# Patient Record
Sex: Female | Born: 1955 | Race: Black or African American | Hispanic: No | Marital: Single | Smoking: Never smoker
Health system: Southern US, Community
[De-identification: ages and names within clinical notes are randomized; demographics above are authoritative.]

## PROBLEM LIST (undated history)

## (undated) DIAGNOSIS — I10 Essential (primary) hypertension: Secondary | ICD-10-CM

## (undated) DIAGNOSIS — E119 Type 2 diabetes mellitus without complications: Secondary | ICD-10-CM

---

## 2009-01-30 ENCOUNTER — Emergency Department (HOSPITAL_COMMUNITY): Admission: EM | Admit: 2009-01-30 | Discharge: 2009-01-30 | Payer: Self-pay | Admitting: Emergency Medicine

## 2010-01-30 ENCOUNTER — Emergency Department (HOSPITAL_COMMUNITY): Admission: EM | Admit: 2010-01-30 | Discharge: 2010-01-30 | Payer: Self-pay | Admitting: Emergency Medicine

## 2010-02-04 ENCOUNTER — Emergency Department (HOSPITAL_COMMUNITY): Admission: EM | Admit: 2010-02-04 | Discharge: 2010-02-04 | Payer: Self-pay | Admitting: Emergency Medicine

## 2010-04-16 ENCOUNTER — Emergency Department (HOSPITAL_COMMUNITY): Admission: EM | Admit: 2010-04-16 | Discharge: 2010-04-16 | Payer: Self-pay | Admitting: Emergency Medicine

## 2011-02-23 LAB — GLUCOSE, CAPILLARY: Glucose-Capillary: 160 mg/dL — ABNORMAL HIGH (ref 70–99)

## 2014-01-09 ENCOUNTER — Emergency Department (HOSPITAL_COMMUNITY)
Admission: EM | Admit: 2014-01-09 | Discharge: 2014-01-09 | Disposition: A | Discharge status: Home / Self Care | Payer: BC Managed Care – PPO | Attending: Emergency Medicine | Admitting: Emergency Medicine

## 2014-01-09 ENCOUNTER — Emergency Department (HOSPITAL_COMMUNITY): Payer: BC Managed Care – PPO

## 2014-01-09 DIAGNOSIS — Y9389 Activity, other specified: Secondary | ICD-10-CM | POA: Insufficient documentation

## 2014-01-09 DIAGNOSIS — W010XXA Fall on same level from slipping, tripping and stumbling without subsequent striking against object, initial encounter: Secondary | ICD-10-CM | POA: Insufficient documentation

## 2014-01-09 DIAGNOSIS — S20211A Contusion of right front wall of thorax, initial encounter: Secondary | ICD-10-CM

## 2014-01-09 DIAGNOSIS — Y929 Unspecified place or not applicable: Secondary | ICD-10-CM | POA: Insufficient documentation

## 2014-01-09 DIAGNOSIS — S20219A Contusion of unspecified front wall of thorax, initial encounter: Secondary | ICD-10-CM | POA: Insufficient documentation

## 2014-01-09 DIAGNOSIS — IMO0002 Reserved for concepts with insufficient information to code with codable children: Secondary | ICD-10-CM | POA: Insufficient documentation

## 2014-01-09 DIAGNOSIS — Z79899 Other long term (current) drug therapy: Secondary | ICD-10-CM | POA: Insufficient documentation

## 2014-01-09 MED ORDER — IBUPROFEN 800 MG PO TABS: 800.0000 mg | ORAL_TABLET | Freq: Three times a day (TID) | ORAL | Status: DC

## 2014-01-09 MED ORDER — OXYCODONE-ACETAMINOPHEN 5-325 MG PO TABS
1.0000 | ORAL_TABLET | Freq: Once | ORAL | Status: AC
  Administered 2014-01-09: 1 via ORAL
  Filled 2014-01-09: qty 1

## 2014-01-09 MED ORDER — OXYCODONE-ACETAMINOPHEN 5-325 MG PO TABS: 1.0000 | ORAL_TABLET | ORAL | Status: DC | PRN

## 2014-01-09 NOTE — ED Provider Notes (Signed)
Medical screening examination/treatment/procedure(s) were performed by non-physician practitioner and as supervising physician I was immediately available for consultation/collaboration.  Gerhard Munchobert Galdino Hinchman, MD 01/09/14 1550

## 2014-01-09 NOTE — ED Provider Notes (Signed)
CSN: [id number]     Arrival date & time 01/09/14  0812 History  This chart was scribed for non-physician practitioner Elpidio AnisShari Dessire Grimes, PA-C working with Gerhard Munchobert Lockwood, MD by Dorothey Basemania Sutton, ED Scribe. This patient was seen in room TR07C/TR07C and the patient's care was started at 9:03 AM.    Chief Complaint  Patient presents with  . Fall   The history is provided by the patient. No language interpreter was used.   HPI Comments: Tabitha Nichols is a 58 y.o. female who presents to the Emergency Department complaining of a fall that occurred 4 days ago when she states she tripped over her dog, causing her to hit the right side of her body on her bed. She reports an associated pain to the right ribs and right breast secondary to the incident that she states is exacerbated with deep breathing and movement. Patient reports some mild, pleuritic chest pain. She denies any bleeding from the nipple, abdominal pain, shortness of breath. Patient has no other pertinent medical history.   No past medical history on file. No past surgical history on file. No family history on file. History  Substance Use Topics  . Smoking status: Not on file  . Smokeless tobacco: Not on file  . Alcohol Use: Not on file   OB History   No data available     Review of Systems  Respiratory: Negative for shortness of breath.        Right rib pain  Cardiovascular: Positive for chest pain.  Gastrointestinal: Negative for abdominal pain.  All other systems reviewed and are negative.   Allergies  Flagyl  Home Medications   Current Outpatient Rx  Name  Route  Sig  Dispense  Refill  . budesonide (RHINOCORT AQUA) 32 MCG/ACT nasal spray   Each Nare   Place 1 spray into both nostrils daily.         . hydrochlorothiazide (HYDRODIURIL) 12.5 MG tablet   Oral   Take 12.5 mg by mouth daily.         . metFORMIN (GLUCOPHAGE) 1000 MG tablet   Oral   Take 1,000 mg by mouth 2 (two) times daily with a meal.         .  NIFEdipine (PROCARDIA-XL/ADALAT-CC/NIFEDICAL-XL) 30 MG 24 hr tablet   Oral   Take 30 mg by mouth daily.         . valsartan (DIOVAN) 320 MG tablet   Oral   Take 320 mg by mouth daily.          Triage Vitals: BP 188/97  Pulse 65  Temp(Src) 98.6 F (37 C) (Oral)  Resp 22  Ht 5\' 7"  (1.702 m)  Wt 193 lb 3.2 oz (87.635 kg)  BMI 30.25 kg/m2  SpO2 98%  Physical Exam  Nursing note and vitals reviewed. Constitutional: She is oriented to person, place, and time. She appears well-developed and well-nourished. No distress.  HENT:  Head: Normocephalic and atraumatic.  Eyes: Conjunctivae are normal.  Neck: Normal range of motion. Neck supple.  Cardiovascular: Normal rate, regular rhythm and normal heart sounds.   Pulmonary/Chest: Effort normal and breath sounds normal. No respiratory distress.  Chest wall appears atraumatic with no ecchymosis, including he right breast. Mildly tender to the right, lower anterior chest wall without other torso tenderness.   Abdominal: Soft. She exhibits no distension. There is no tenderness.  No RUQ tenderness.   Musculoskeletal: Normal range of motion.  Neurological: She is alert and oriented to person, place,  and time.  Skin: Skin is warm and dry.  Psychiatric: She has a normal mood and affect. Her behavior is normal.    ED Course  Procedures (including critical care time)  DIAGNOSTIC STUDIES: Oxygen Saturation is 98% on room air, normal by my interpretation.    COORDINATION OF CARE: 9:05 AM- Ordered a chest x-ray. Ordered Percocet to manage symptoms. Discussed treatment plan with patient at bedside and patient verbalized agreement.     Labs Review Labs Reviewed - No data to display  Imaging Review Dg Ribs Unilateral W/chest Right  01/09/2014   CLINICAL DATA:  Larey Seat on that post Saturday. Pain anterior right chest and ribs.  EXAM: RIGHT RIBS AND CHEST - 3+ VIEW  COMPARISON:  None.  FINDINGS: No fracture or other bone lesions are seen  involving the ribs. There is no evidence of pneumothorax or pleural effusion. Both lungs are clear. Heart size and mediastinal contours are within normal limits.  IMPRESSION: Negative.   Electronically Signed   By: Britta Mccreedy M.D.   On: 01/09/2014 09:18    EKG Interpretation   None       MDM   Final diagnoses:  None    1. CONTUSION CHEST WALL VSS, no hypoxia, mild tenderness to palpation. Negative CXR. Stable for discharge.   I personally performed the services described in this documentation, which was scribed in my presence. The recorded information has been reviewed and is accurate.       Arnoldo Hooker, PA-C 01/09/14 [phone]

## 2014-01-09 NOTE — ED Notes (Signed)
Larey SeatFell and hit rt side of chest against bed rail after trying to catch dog on Friday hurts to breath

## 2014-01-09 NOTE — Discharge Instructions (Signed)
Chest Contusion °A chest contusion is a deep bruise on your chest area. Contusions are the result of an injury that caused bleeding under the skin. A chest contusion may involve bruising of the skin, muscles, or ribs. The contusion may turn blue, purple, or yellow. Minor injuries will give you a painless contusion, but more severe contusions may stay painful and swollen for a few weeks. °CAUSES  °A contusion is usually caused by a blow, trauma, or direct force to an area of the body. °SYMPTOMS  °· Swelling and redness of the injured area. °· Discoloration of the injured area. °· Tenderness and soreness of the injured area. °· Pain. °DIAGNOSIS  °The diagnosis can be made by taking a history and performing a physical exam. An X-ray, CT scan, or MRI may be needed to determine if there were any associated injuries, such as broken bones (fractures) or internal injuries. °TREATMENT  °Often, the best treatment for a chest contusion is resting, icing, and applying cold compresses to the injured area. Deep breathing exercises may be recommended to reduce the risk of pneumonia. Over-the-counter medicines may also be recommended for pain control. °HOME CARE INSTRUCTIONS  °· Put ice on the injured area. °· Put ice in a plastic bag. °· Place a towel between your skin and the bag. °· Leave the ice on for 15-20 minutes, 03-04 times a day. °· Only take over-the-counter or prescription medicines as directed by your caregiver. Your caregiver may recommend avoiding anti-inflammatory medicines (aspirin, ibuprofen, and naproxen) for 48 hours because these medicines may increase bruising. °· Rest the injured area. °· Perform deep-breathing exercises as directed by your caregiver. °· Stop smoking if you smoke. °· Do not lift objects over 5 pounds (2.3 kg) for 3 days or longer if recommended by your caregiver. °SEEK IMMEDIATE MEDICAL CARE IF:  °· You have increased bruising or swelling. °· You have pain that is getting worse. °· You have  difficulty breathing. °· You have dizziness, weakness, or fainting. °· You have blood in your urine or stool. °· You cough up or vomit blood. °· Your swelling or pain is not relieved with medicines. °MAKE SURE YOU:  °· Understand these instructions. °· Will watch your condition. °· Will get help right away if you are not doing well or get worse. °Document Released: 08/11/2001 Document Revised: 08/10/2012 Document Reviewed: 05/09/2012 °[employer]® Patient Information ©2014 [employer]. ° °

## 2014-08-06 ENCOUNTER — Encounter (HOSPITAL_COMMUNITY): Payer: Self-pay | Admitting: Emergency Medicine

## 2014-08-06 ENCOUNTER — Emergency Department (HOSPITAL_COMMUNITY): Payer: BC Managed Care – PPO

## 2014-08-06 ENCOUNTER — Emergency Department (HOSPITAL_COMMUNITY)
Admission: EM | Admit: 2014-08-06 | Discharge: 2014-08-06 | Disposition: A | Discharge status: Home / Self Care | Payer: BC Managed Care – PPO | Attending: Emergency Medicine | Admitting: Emergency Medicine

## 2014-08-06 DIAGNOSIS — S20211A Contusion of right front wall of thorax, initial encounter: Secondary | ICD-10-CM

## 2014-08-06 DIAGNOSIS — S20229A Contusion of unspecified back wall of thorax, initial encounter: Secondary | ICD-10-CM | POA: Diagnosis not present

## 2014-08-06 DIAGNOSIS — IMO0002 Reserved for concepts with insufficient information to code with codable children: Secondary | ICD-10-CM | POA: Insufficient documentation

## 2014-08-06 DIAGNOSIS — W108XXA Fall (on) (from) other stairs and steps, initial encounter: Secondary | ICD-10-CM | POA: Diagnosis not present

## 2014-08-06 DIAGNOSIS — Y9289 Other specified places as the place of occurrence of the external cause: Secondary | ICD-10-CM | POA: Diagnosis not present

## 2014-08-06 DIAGNOSIS — W010XXA Fall on same level from slipping, tripping and stumbling without subsequent striking against object, initial encounter: Secondary | ICD-10-CM | POA: Insufficient documentation

## 2014-08-06 DIAGNOSIS — S20219A Contusion of unspecified front wall of thorax, initial encounter: Secondary | ICD-10-CM | POA: Insufficient documentation

## 2014-08-06 DIAGNOSIS — E119 Type 2 diabetes mellitus without complications: Secondary | ICD-10-CM | POA: Insufficient documentation

## 2014-08-06 DIAGNOSIS — S300XXA Contusion of lower back and pelvis, initial encounter: Secondary | ICD-10-CM

## 2014-08-06 DIAGNOSIS — Z79899 Other long term (current) drug therapy: Secondary | ICD-10-CM | POA: Insufficient documentation

## 2014-08-06 DIAGNOSIS — I10 Essential (primary) hypertension: Secondary | ICD-10-CM | POA: Diagnosis not present

## 2014-08-06 DIAGNOSIS — Y9389 Activity, other specified: Secondary | ICD-10-CM | POA: Diagnosis not present

## 2014-08-06 HISTORY — DX: Type 2 diabetes mellitus without complications: E11.9

## 2014-08-06 HISTORY — DX: Essential (primary) hypertension: I10

## 2014-08-06 MED ORDER — IBUPROFEN 800 MG PO TABS
800.0000 mg | ORAL_TABLET | Freq: Once | ORAL | Status: AC
  Administered 2014-08-06: 800 mg via ORAL
  Filled 2014-08-06: qty 1

## 2014-08-06 MED ORDER — IBUPROFEN 800 MG PO TABS: 800.0000 mg | ORAL_TABLET | Freq: Three times a day (TID) | ORAL | Status: AC | PRN

## 2014-08-06 MED ORDER — HYDROCODONE-ACETAMINOPHEN 5-325 MG PO TABS
1.0000 | ORAL_TABLET | Freq: Once | ORAL | Status: AC
Start: 2014-08-06 — End: 2014-08-06
  Administered 2014-08-06: 1 via ORAL
  Filled 2014-08-06: qty 1

## 2014-08-06 MED ORDER — HYDROCODONE-ACETAMINOPHEN 5-325 MG PO TABS: 1.0000 | ORAL_TABLET | Freq: Four times a day (QID) | ORAL | Status: AC | PRN

## 2014-08-06 NOTE — Progress Notes (Signed)
  CARE MANAGEMENT ED NOTE 08/06/2014  Patient:  Tabitha Nichols, Tabitha Nichols   Account Number:  [id number]  Date Initiated:  08/06/2014  Documentation initiated by:  Edd Arbour  Subjective/Objective Assessment:   58 yr old [place] early this morning she fell/slipped down her stairs. Denies hitting head, denies LOC. Reports right sided back pain 7/10 at present. Ambulatory.     Subjective/Objective Assessment Detail:   pcp is olaff massenburg PA [phone]  Pt inquired about pain medication until her xrays return ED CM notified PA/NP, chris     Action/Plan:   EPIC updated   Action/Plan Detail:   Anticipated DC Date:  08/06/2014     Status Recommendation to Physician:   Result of Recommendation:    Other ED Services  Consult Working Plan    DC Planning Services  Other  PCP issues  Outpatient Services - Pt will follow up    Choice offered to / List presented to:            Status of service:  Completed, signed off  ED Comments:   ED Comments Detail:

## 2014-08-06 NOTE — ED Provider Notes (Signed)
CSN: [id number]     Arrival date & time 08/06/14  1440 History  This chart was scribed for non-physician practitioner, Ebbie Ridge, PA-C working with Mirian Mo, MD by Greggory Stallion, ED scribe. This patient was seen in room WTR9/WTR9 and the patient's care was started at 3:46 PM.   Chief Complaint  Patient presents with  . Fall  . Back Pain   The history is provided by the patient. No language interpreter was used.   HPI Comments: Tabitha Nichols is a 58 y.o. female who presents to the Emergency Department complaining of a fall that occurred earlier this morning. States she slipped and fell down 3-4 steps, landing on her back. Denies hitting her head or LOC. She has sudden onset right lower back pain that radiates into her buttock. Movements worsen the pain.   Past Medical History  Diagnosis Date  . Hypertension   . Diabetes mellitus without complication    History reviewed. No pertinent past surgical history. History reviewed. No pertinent family history. History  Substance Use Topics  . Smoking status: Never Smoker   . Smokeless tobacco: Not on file  . Alcohol Use: Yes     Comment: occasionally   OB History   Grav Para Term Preterm Abortions TAB SAB Ect Mult Living                 Review of Systems All other systems negative except as documented in the HPI. All pertinent positives and negatives as reviewed in the HPI.  Allergies  Flagyl  Home Medications   Prior to Admission medications   Medication Sig Start Date End Date Taking? Authorizing Provider  budesonide (RHINOCORT AQUA) 32 MCG/ACT nasal spray Place 1 spray into both nostrils daily.    Historical Provider, MD  hydrochlorothiazide (HYDRODIURIL) 12.5 MG tablet Take 12.5 mg by mouth daily.    Historical Provider, MD  ibuprofen (ADVIL,MOTRIN) 800 MG tablet Take 1 tablet (800 mg total) by mouth 3 (three) times daily. 01/09/14   Shari A Upstill, PA-C  metFORMIN (GLUCOPHAGE) 1000 MG tablet Take 1,000 mg by mouth 2  (two) times daily with a meal.    Historical Provider, MD  NIFEdipine (PROCARDIA-XL/ADALAT-CC/NIFEDICAL-XL) 30 MG 24 hr tablet Take 30 mg by mouth daily.    Historical Provider, MD  oxyCODONE-acetaminophen (PERCOCET/ROXICET) 5-325 MG per tablet Take 1 tablet by mouth every 4 (four) hours as needed for severe pain. 01/09/14   Shari A Upstill, PA-C  valsartan (DIOVAN) 320 MG tablet Take 320 mg by mouth daily.    Historical Provider, MD   BP 149/88  Pulse 69  Temp(Src) 98.5 F (36.9 C) (Oral)  Resp 16  SpO2 100%  Physical Exam  Nursing note and vitals reviewed. Constitutional: She is oriented to person, place, and time. She appears well-developed and well-nourished. No distress.  HENT:  Head: Normocephalic and atraumatic.  Eyes: Conjunctivae and EOM are normal.  Neck: Neck supple.  Cardiovascular: Normal rate.   Pulmonary/Chest: Effort normal. No respiratory distress.  Musculoskeletal: Normal range of motion.  Tenderness over right lateral and posterior ribs. Tenderness to right lower back.  Neurological: She is alert and oriented to person, place, and time.  Skin: Skin is warm and dry.  Psychiatric: She has a normal mood and affect. Her behavior is normal.    ED Course  Procedures (including critical care time)  DIAGNOSTIC STUDIES: Oxygen Saturation is 10% on RA, normal by my interpretation.    COORDINATION OF CARE: 3:48 PM-Discussed treatment plan which  includes xray with pt at bedside and pt agreed to plan.   Labs Review Labs Reviewed - No data to display  Imaging Review Dg Ribs Unilateral W/chest Right  08/06/2014   CLINICAL DATA:  Fall, anterior right rib pain  EXAM: RIGHT RIBS AND CHEST - 3+ VIEW  COMPARISON:  01/09/2014 similar exam  FINDINGS: No fracture or other bone lesions are seen involving the ribs. There is no evidence of pneumothorax or pleural effusion. Both lungs are clear. Heart size and mediastinal contours are within normal limits.  IMPRESSION: Negative.    Electronically Signed   By: Christiana Pellant M.D.   On: 08/06/2014 16:27   Dg Lumbar Spine Complete  08/06/2014   CLINICAL DATA:  Fall, low back pain  EXAM: LUMBAR SPINE - COMPLETE 4+ VIEW  COMPARISON:  None.  FINDINGS: There is no evidence of lumbar spine fracture. Alignment is normal. Intervertebral disc spaces are maintained. Minimal leftward curvature centered at L1. Minimal disc degenerative change noted at L3-L4 and L4-L5.  IMPRESSION: Negative.   Electronically Signed   By: Christiana Pellant M.D.   On: 08/06/2014 16:26      I personally performed the services described in this documentation, which was scribed in my presence. The recorded information has been reviewed and is accurate.  Carlyle Dolly, PA-C 08/13/14 [phone]

## 2014-08-06 NOTE — ED Notes (Signed)
Pt knows not to take extra tylenol and not to drink alcohol/drive/operate heavy machinery with medications prescribed. No other questions/concerns. Ambulatory with steady gait.

## 2014-08-06 NOTE — Discharge Instructions (Signed)
Return here as needed. Follow up with your doctor. Your x-rays were normal. Use ice and heat on the areas that are sore.

## 2014-08-06 NOTE — ED Notes (Signed)
Pt reports early this morning she fell/slipped down her stairs. Denies hitting head, denies LOC. Reports right sided back pain 7/10 at present. Ambulatory.

## 2014-08-11 ENCOUNTER — Emergency Department (HOSPITAL_COMMUNITY)
Admission: EM | Admit: 2014-08-11 | Discharge: 2014-08-11 | Disposition: A | Discharge status: Home / Self Care | Payer: BC Managed Care – PPO | Attending: Emergency Medicine | Admitting: Emergency Medicine

## 2014-08-11 ENCOUNTER — Encounter (HOSPITAL_COMMUNITY): Payer: Self-pay | Admitting: Emergency Medicine

## 2014-08-11 DIAGNOSIS — Z79899 Other long term (current) drug therapy: Secondary | ICD-10-CM | POA: Diagnosis not present

## 2014-08-11 DIAGNOSIS — G8911 Acute pain due to trauma: Secondary | ICD-10-CM | POA: Insufficient documentation

## 2014-08-11 DIAGNOSIS — Z791 Long term (current) use of non-steroidal anti-inflammatories (NSAID): Secondary | ICD-10-CM | POA: Insufficient documentation

## 2014-08-11 DIAGNOSIS — S20211D Contusion of right front wall of thorax, subsequent encounter: Secondary | ICD-10-CM

## 2014-08-11 DIAGNOSIS — I10 Essential (primary) hypertension: Secondary | ICD-10-CM | POA: Insufficient documentation

## 2014-08-11 DIAGNOSIS — R0789 Other chest pain: Secondary | ICD-10-CM | POA: Diagnosis present

## 2014-08-11 DIAGNOSIS — E119 Type 2 diabetes mellitus without complications: Secondary | ICD-10-CM | POA: Insufficient documentation

## 2014-08-11 MED ORDER — IBUPROFEN 800 MG PO TABS: 800.0000 mg | ORAL_TABLET | Freq: Three times a day (TID) | ORAL | Status: AC

## 2014-08-11 MED ORDER — IBUPROFEN 800 MG PO TABS
800.0000 mg | ORAL_TABLET | Freq: Once | ORAL | Status: AC
  Administered 2014-08-11: 800 mg via ORAL
  Filled 2014-08-11: qty 1

## 2014-08-11 MED ORDER — OXYCODONE-ACETAMINOPHEN 5-325 MG PO TABS: 1.0000 | ORAL_TABLET | Freq: Four times a day (QID) | ORAL | Status: AC | PRN

## 2014-08-11 NOTE — ED Provider Notes (Signed)
CSN: [id number]     Arrival date & time 08/11/14  1220 History  This chart was scribed for non-physician practitioner, Francee Piccolo, PA-C working with Toy Cookey, MD by Greggory Stallion, ED scribe. This patient was seen in room WTR9/WTR9 and the patient's care was started at 1:06 PM.    Chief Complaint  Patient presents with  . Back Pain   The history is provided by the patient. No language interpreter was used.   HPI Comments: Tabitha Nichols is a 58 y.o. female who presents to the Emergency Department complaining of continued back pain from a fall on 08/06/14 when she fell down 3 steps and landed on her right back. Pt was evaluated after the incident and discharged home with vicodin and 800 mg ibuprofen. Medications have provided little relief. Rib and lumbar xrays were done and were negative. She states the pain is now to her right mid back. Deep breathing worsens the pain. She has used icy hot and done warm compresses with no relief. Denies hemoptysis, nausea, emesis, rash.   Past Medical History  Diagnosis Date  . Hypertension   . Diabetes mellitus without complication    History reviewed. No pertinent past surgical history. No family history on file. History  Substance Use Topics  . Smoking status: Never Smoker   . Smokeless tobacco: Not on file  . Alcohol Use: Yes     Comment: occasionally   OB History   Grav Para Term Preterm Abortions TAB SAB Ect Mult Living                 Review of Systems  Respiratory: Negative for cough.   Gastrointestinal: Negative for nausea and vomiting.  Musculoskeletal: Positive for back pain.  Skin: Negative for rash.  All other systems reviewed and are negative.  Allergies  Flagyl  Home Medications   Prior to Admission medications   Medication Sig Start Date End Date Taking? Authorizing Provider  glipiZIDE (GLUCOTROL) 5 MG tablet Take 5 mg by mouth 2 (two) times daily before a meal.   Yes Historical Provider, MD   hydrochlorothiazide (HYDRODIURIL) 12.5 MG tablet Take 12.5 mg by mouth daily.   Yes Historical Provider, MD  HYDROcodone-acetaminophen (NORCO/VICODIN) 5-325 MG per tablet Take 1 tablet by mouth every 6 (six) hours as needed for moderate pain. 08/06/14  Yes Jamesetta Orleans Lawyer, PA-C  ibuprofen (ADVIL,MOTRIN) 800 MG tablet Take 1 tablet (800 mg total) by mouth every 8 (eight) hours as needed. 08/06/14  Yes Jamesetta Orleans Lawyer, PA-C  NIFEdipine (PROCARDIA-XL/ADALAT-CC/NIFEDICAL-XL) 30 MG 24 hr tablet Take 30 mg by mouth daily.   Yes Historical Provider, MD  polyvinyl alcohol (LIQUIFILM TEARS) 1.4 % ophthalmic solution Place 1 drop into both eyes as needed for dry eyes.   Yes Historical Provider, MD  valsartan (DIOVAN) 320 MG tablet Take 320 mg by mouth daily.   Yes Historical Provider, MD  ibuprofen (ADVIL,MOTRIN) 800 MG tablet Take 1 tablet (800 mg total) by mouth 3 (three) times daily. 08/11/14   Ceria Suminski L Dorethea Strubel, PA-C  oxyCODONE-acetaminophen (PERCOCET) 5-325 MG per tablet Take 1-2 tablets by mouth every 6 (six) hours as needed for severe pain. 08/11/14   Nadia Viar L Shatisha Falter, PA-C   BP 134/73  Pulse 66  Temp(Src) 99.3 F (37.4 C) (Oral)  Resp 16  SpO2 98%  Physical Exam  Nursing note and vitals reviewed. Constitutional: She is oriented to person, place, and time. She appears well-developed and well-nourished. No distress.  HENT:  Head: Normocephalic and atraumatic.  Right Ear: External ear normal.  Left Ear: External ear normal.  Nose: Nose normal.  Mouth/Throat: Oropharynx is clear and moist. No oropharyngeal exudate.  Eyes: Conjunctivae and EOM are normal. Pupils are equal, round, and reactive to light.  Neck: Normal range of motion. Neck supple.  Cardiovascular: Normal rate, regular rhythm, normal heart sounds and intact distal pulses.   Pulmonary/Chest: Effort normal and breath sounds normal. No respiratory distress. She exhibits tenderness and swelling (mild). She exhibits no  crepitus, no deformity and no retraction.    Abdominal: Soft. There is no tenderness.  Musculoskeletal:       Lumbar back: Normal.  Neurological: She is alert and oriented to person, place, and time. She has normal strength. No cranial nerve deficit. Gait normal. GCS eye subscore is 4. GCS verbal subscore is 5. GCS motor subscore is 6.  Sensation grossly intact.  No pronator drift.  Bilateral heel-knee-shin intact.  Skin: Skin is warm and dry. She is not diaphoretic.    ED Course  Procedures (including critical care time) Medications  ibuprofen (ADVIL,MOTRIN) tablet 800 mg (800 mg Oral Given 08/11/14 1342)    DIAGNOSTIC STUDIES: Oxygen Saturation is 98% on RA, normal by my interpretation.    COORDINATION OF CARE: 1:10 PM-Discussed treatment plan which includes alternating motrin and vicodin with pt at bedside and pt agreed to plan.   Labs Review Labs Reviewed - No data to display  Imaging Review No results found.   EKG Interpretation None      Imaging Review  Dg Ribs Unilateral W/chest Right  08/06/2014 CLINICAL DATA: Fall, anterior right rib pain EXAM: RIGHT RIBS AND CHEST - 3+ VIEW COMPARISON: 01/09/2014 similar exam FINDINGS: No fracture or other bone lesions are seen involving the ribs. There is no evidence of pneumothorax or pleural effusion. Both lungs are clear. Heart size and mediastinal contours are within normal limits. IMPRESSION: Negative. Electronically Signed By: Christiana Pellant M.D. On: 08/06/2014 16:27   MDM   Final diagnoses:  Rib contusion, right, subsequent encounter    Filed Vitals:   08/11/14 1343  BP:   Pulse: 66  Temp:   Resp:    Afebrile, NAD, non-toxic appearing, AAOx4.  VSS, no hypoxia, mild tenderness to palpation. Negative CXR on 08/08/14 w/o evidence of rib fracture. Stable for discharge.    I personally performed the services described in this documentation, which was scribed in my presence. The recorded information has been reviewed  and is accurate.  Jeannetta Ellis, PA-C 08/11/14 1654

## 2014-08-11 NOTE — Discharge Instructions (Signed)
Please follow up with your primary care physician in 1-2 days. If you do not have one please call the [facility] number listed above. Please take pain medication and/or muscle relaxants as prescribed and as needed for pain. Please do not drive on narcotic pain medication or on muscle relaxants. Please alternate use of the Percocet and the Motrin to help with break through pain. Please read all discharge instructions and return precautions.   Rib Contusion A rib contusion (bruise) can occur by a blow to the chest or by a fall against a hard object. Usually these will be much better in a couple weeks. If X-rays were taken today and there are no broken bones (fractures), the diagnosis of bruising is made. However, broken ribs may not show up for several days, or may be discovered later on a routine X-ray when signs of healing show up. If this happens to you, it does not mean that something was missed on the X-ray, but simply that it did not show up on the first X-rays. Earlier diagnosis will not usually change the treatment. HOME CARE INSTRUCTIONS   Avoid strenuous activity. Be careful during activities and avoid bumping the injured ribs. Activities that pull on the injured ribs and cause pain should be avoided, if possible.  For the first day or two, an ice pack used every 20 minutes while awake may be helpful. Put ice in a plastic bag and put a towel between the bag and the skin.  Eat a normal, well-balanced diet. Drink plenty of fluids to avoid constipation.  Take deep breaths several times a day to keep lungs free of infection. Try to cough several times a day. Splint the injured area with a pillow while coughing to ease pain. Coughing can help prevent pneumonia.  Wear a rib belt or binder only if told to do so by your caregiver. If you are wearing a rib belt or binder, you must do the breathing exercises as directed by your caregiver. If not used properly, rib belts or binders  restrict breathing which can lead to pneumonia.  Only take over-the-counter or prescription medicines for pain, discomfort, or fever as directed by your caregiver. SEEK MEDICAL CARE IF:   You or your child has an oral temperature above 102 F (38.9 C).  Your baby is older than 3 months with a rectal temperature of 100.5 F (38.1 C) or higher for more than 1 day.  You develop a cough, with thick or bloody sputum. SEEK IMMEDIATE MEDICAL CARE IF:   You have difficulty breathing.  You feel sick to your stomach (nausea), have vomiting or belly (abdominal) pain.  You have worsening pain, not controlled with medications, or there is a change in the location of the pain.  You develop sweating or radiation of the pain into the arms, jaw or shoulders, or become light headed or faint.  You or your child has an oral temperature above 102 F (38.9 C), not controlled by medicine.  Your or your baby is older than 3 months with a rectal temperature of 102 F (38.9 C) or higher.  Your baby is 81 months old or younger with a rectal temperature of 100.4 F (38 C) or higher. MAKE SURE YOU:   Understand these instructions.  Will watch your condition.  Will get help right away if you are not doing well or get worse. Document Released: 08/11/2001 Document Revised: 03/13/2013 Document Reviewed: 07/04/2008 [facility] Patient Information 2015 [place], [place]. This  information is not intended to replace advice given to you by your health care provider. Make sure you discuss any questions you have with your health care provider.

## 2014-08-11 NOTE — ED Notes (Signed)
Pt reports that while on her way into work she fell backwards down onto some steps on 08/06/2014 and was seen here at [facility]. Pt reports right sided - mid back pain. Pt states that the pain is in a higher location that she described on her previous visit, however pt states pain has persisted and is unable to sleep at night. Pt states that the pain is better with compression of the area. Pt is A/O x4, vitals are WDL, and pt is in NAD.

## 2014-08-12 NOTE — ED Provider Notes (Signed)
Medical screening examination/treatment/procedure(s) were performed by non-physician practitioner and as supervising physician I was immediately available for consultation/collaboration.   EKG Interpretation None        Toy Cookey, MD 08/12/14 1909

## 2014-08-19 NOTE — ED Provider Notes (Signed)
Medical screening examination/treatment/procedure(s) were performed by non-physician practitioner and as supervising physician I was immediately available for consultation/collaboration.   EKG Interpretation None        Mirian Mo, MD 08/19/14 1505

## 2016-02-05 IMAGING — CR DG RIBS W/ CHEST 3+V*R*
3 series · 3 of 3 positions shown · non-contrast
Comparison: 01/09/2014 similar exam

CLINICAL DATA: Fall, anterior right rib pain

EXAM:
RIGHT RIBS AND CHEST - 3+ VIEW

[t ribs rpo right]
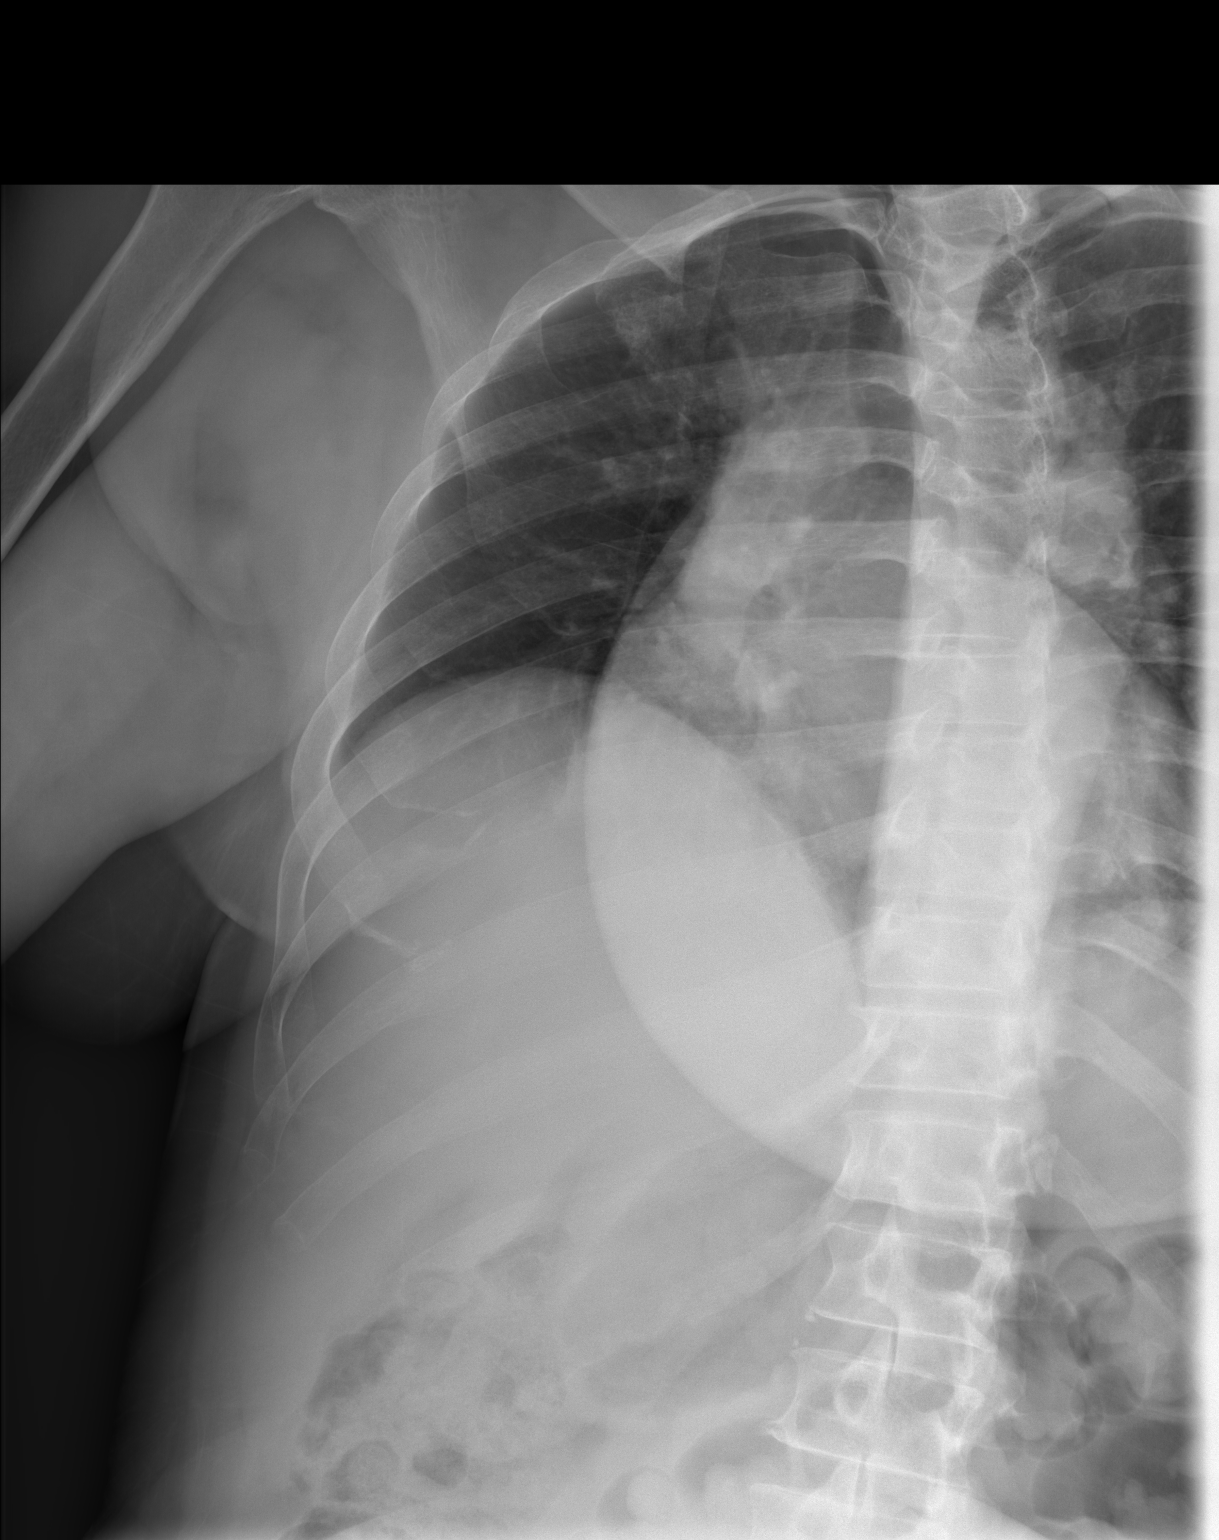

[t ribs ap upper right]
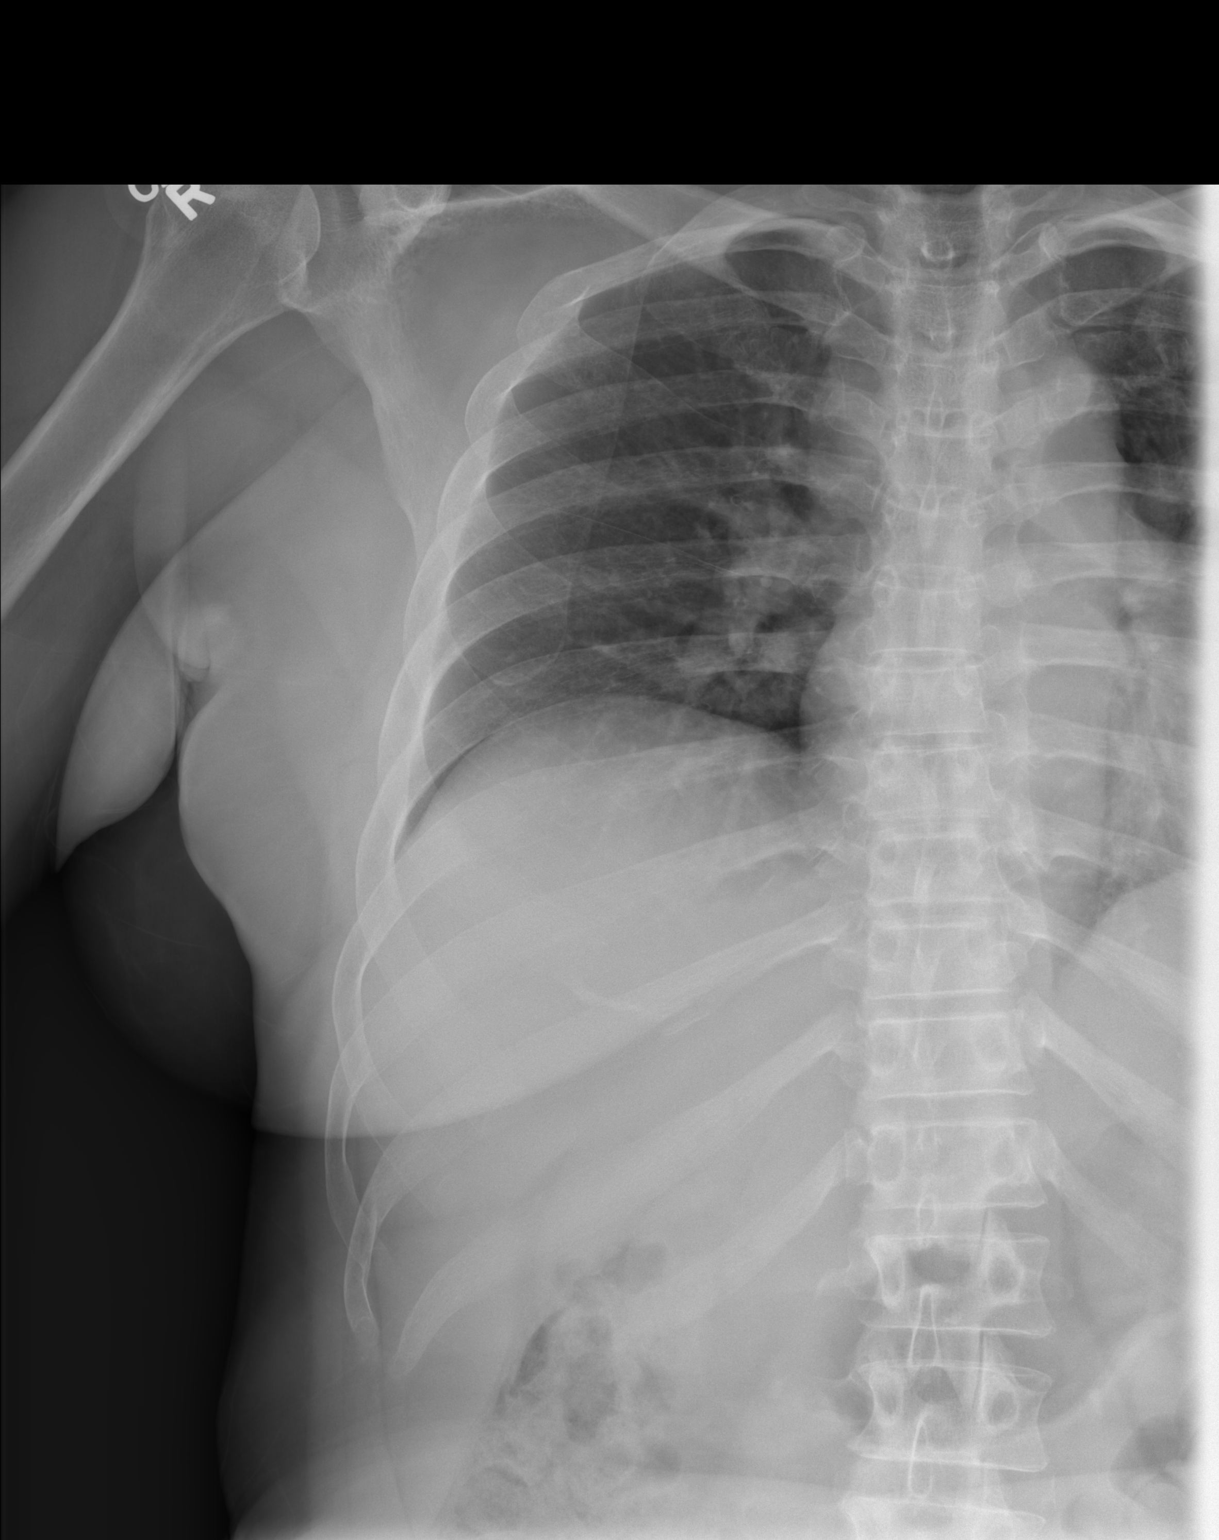

[t chest supine]
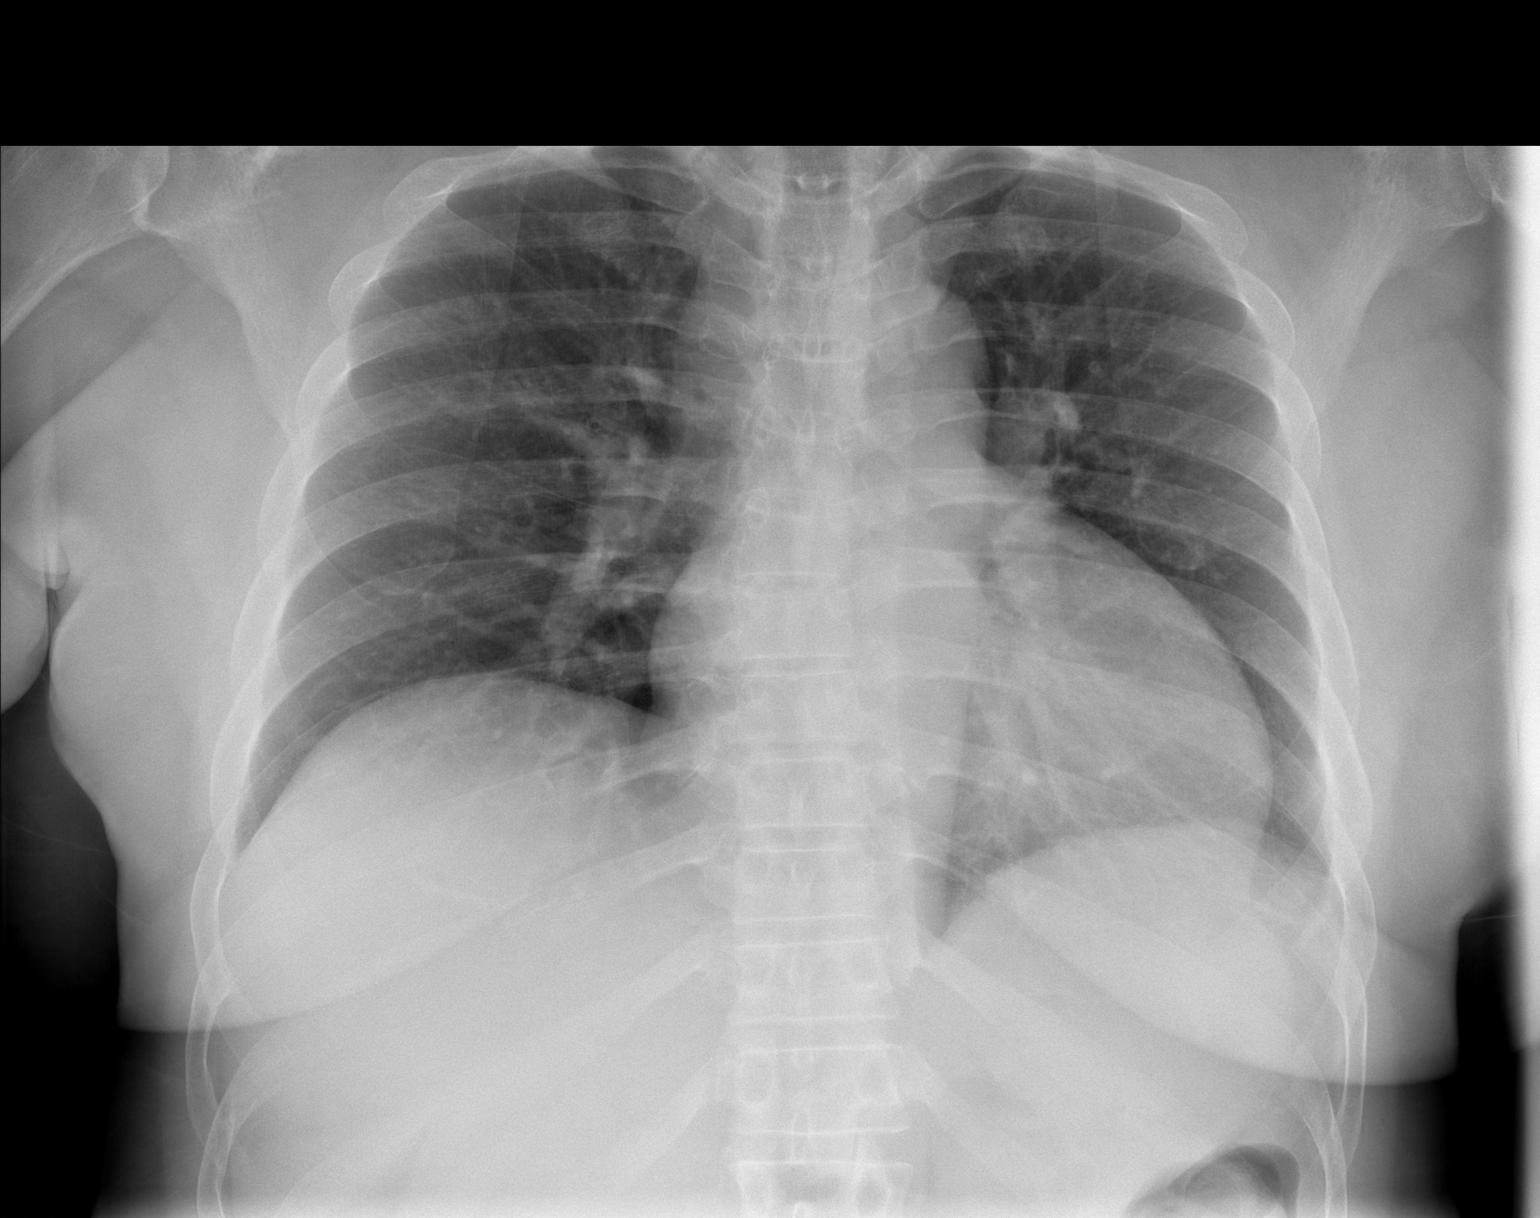

[3 of 3 positions shown; findings below may reference images not displayed]

FINDINGS: No fracture or other bone lesions are seen involving the ribs. There
is no evidence of pneumothorax or pleural effusion. Both lungs are
clear. Heart size and mediastinal contours are within normal limits.
IMPRESSION: Negative.
